# Patient Record
Sex: Female | Born: 1960 | Race: White | Hispanic: Yes | Marital: Married
Health system: Northeastern US, Academic
[De-identification: ages and names within clinical notes are randomized; demographics above are authoritative.]

---

## 2021-08-07 ENCOUNTER — Other Ambulatory Visit

## 2021-08-07 ENCOUNTER — Other Ambulatory Visit (HOSPITAL_BASED_OUTPATIENT_CLINIC_OR_DEPARTMENT_OTHER)

## 2021-08-08 ENCOUNTER — Encounter (HOSPITAL_BASED_OUTPATIENT_CLINIC_OR_DEPARTMENT_OTHER)

## 2021-08-08 ENCOUNTER — Other Ambulatory Visit (HOSPITAL_BASED_OUTPATIENT_CLINIC_OR_DEPARTMENT_OTHER)

## 2021-08-08 ENCOUNTER — Ambulatory Visit
Admission: RE | Admit: 2021-08-08 | Discharge: 2021-08-08 | Disposition: A | Discharge status: Home / Self Care | Source: Ambulatory Visit

## 2021-08-08 DIAGNOSIS — D259 Leiomyoma of uterus, unspecified: Secondary | ICD-10-CM

## 2021-08-16 ENCOUNTER — Encounter (HOSPITAL_BASED_OUTPATIENT_CLINIC_OR_DEPARTMENT_OTHER): Admitting: Gynecology

## 2021-09-19 ENCOUNTER — Other Ambulatory Visit

## 2021-09-20 ENCOUNTER — Other Ambulatory Visit

## 2021-09-20 ENCOUNTER — Encounter (HOSPITAL_BASED_OUTPATIENT_CLINIC_OR_DEPARTMENT_OTHER): Admitting: Gynecology

## 2021-09-25 ENCOUNTER — Ambulatory Visit (HOSPITAL_BASED_OUTPATIENT_CLINIC_OR_DEPARTMENT_OTHER): Admitting: Gynecology

## 2021-10-05 ENCOUNTER — Encounter

## 2021-10-05 ENCOUNTER — Other Ambulatory Visit

## 2021-10-05 ENCOUNTER — Ambulatory Visit (HOSPITAL_BASED_OUTPATIENT_CLINIC_OR_DEPARTMENT_OTHER): Admitting: Gynecology

## 2021-10-05 ENCOUNTER — Ambulatory Visit: Attending: Gynecology | Admitting: Gynecology

## 2021-10-05 VITALS — BP 127/84 | HR 73 | Temp 98.1°F | Ht 62.0 in | Wt 125.0 lb

## 2021-10-05 DIAGNOSIS — N952 Postmenopausal atrophic vaginitis: Secondary | ICD-10-CM

## 2021-10-05 MED ORDER — estradiol (Estrace) 0.01 % (0.1 mg/gram) vaginal cream
0.01 | Freq: Every evening | VAGINAL | 8 refills | Status: AC
Start: 2021-10-05 — End: 2022-10-05
  Filled 2021-10-05: qty 42.5, 90d supply, fill #0

## 2021-10-05 NOTE — Progress Notes (Signed)
Patient ID: April Dickson is a 61 y.o. female.    Uroflowmetry    Date/Time: 10/05/2021 1:24 PM    Performed by: Eliezer Bottom, RN  Authorized by: Ansel Bong, MD    Procedure Details     Procedure: uroflow      Uroflow Details:     Voiding duration (sec): 17.3    Average flow rate (mL/sec): 8.1    Max flow rate (mL/sec): 18.3    Type of curve: bell variant      Voided urine (mL): 140    Normal void for patient: yes      Post-Procedure Details     Outcome: patient tolerated procedure well with no complications

## 2021-10-05 NOTE — Progress Notes (Signed)
Division of Urogynecology and Reconstructive Pelvic Surgery    NEW CONSULTATION VISIT    Referring Physician: No ref. provider found  No care team member to display    IDENTIFICATION AND CHIEF COMPLAINT  April Dickson is a 61 y.o. No obstetric history on file. female who presents for a new patient consultation.    HISTORY OF PRESENT ILLNESS    Has left sided abdominal pain, right above the pubic area   It is a cramping pain if lays down and puts legs up   Occurs twice a month  Presents here for pelvic pain   If constipated which occurs occasionally   +POP sx   Had a pessary in but took it out 6 months ago   Didn't feel like it was helping her pain or discomfort   Denies SUI or UUI   Still has her ovaries  History of supracervical hysterectomy   Lives in [place], but her daughter lives in [place] so she comes back and forth   History of diverticulosis   L sided pain, up to 7 or 8/10  Happens twice a month   Went to [facility] - helped   Sometimes feels some dull pain when she urinates   Some vaginal dryness       PROBLEM LIST  There are no problems to display for this patient.      HISTORY  No past medical history on file.    No past surgical history on file.    OB History   No obstetric history on file.         No family history on file.    Social History     Socioeconomic History   . Marital status: Married     Spouse name: Not on file   . Number of children: Not on file   . Years of education: Not on file   . Highest education level: Not on file   Occupational History   . Not on file   Tobacco Use   . Smoking status: Not on file   . Smokeless tobacco: Not on file   Substance and Sexual Activity   . Alcohol use: Not on file   . Drug use: Not on file   . Sexual activity: Not on file   Other Topics Concern   . Not on file   Social History Narrative   . Not on file     Social Determinants of Health     Financial Resource Strain: Not on file   Food Insecurity: Not on file   Transportation Needs: Not on file   Physical  Activity: Not on file   Stress: Not on file   Social Connections: Not on file   Intimate Partner Violence: Not on file   Housing Stability: Not on file       Current Outpatient Medications   Medication Sig Dispense Refill   . levothyroxine (Synthroid, Levoxyl) 25 mcg tablet Take 25 mcg by mouth in the morning.       No current facility-administered medications for this visit.       ALLERGIES:    Iodine-isopropyl alcohol, Sulfa (sulfonamide antibiotics), and Sulfamethoxazole-trimethoprim    REVIEW OF SYSTEMS    Review of Systems   Constitutional: Negative.    HENT: Negative.    Eyes: Negative.    Respiratory: Negative.    Cardiovascular: Negative.    Gastrointestinal: Negative.    Endocrine: Negative.    Genitourinary: Negative.    Musculoskeletal: Negative.  Skin: Negative.    Allergic/Immunologic: Negative.    Neurological: Negative.    Hematological: Negative.    Psychiatric/Behavioral: Negative.        PHYSICAL EXAM  BP 127/84 (BP Location: Right arm, Patient Position: Sitting, BP Cuff Size: Adult)   Pulse 73   Temp 36.7 C (98.1 F) (Oral)   Ht 1.575 m   Wt 56.7 kg   BMI 22.86 kg/m     Constitutional: Pleasant, cooperative, appears stated age.   HENT: Head normocephalic and atraumatic.   Cardiovascular: No peripheral edema  Pulmonary/Chest: Effort normal. No increased work of breathing.   Abdominal: Soft, nontender, nondistended  Neurological: Alert   Psychiatric: Normal mood and affect.     PELVIC:   Vulva: no lesions, nontender  Urethra: nontender, no mass/lesion  +urethral hypermobility   Bladder: nontender  Vagina: nontender, no mass/lesion  Estrogenization: atrophic  Cervix: normal, no lesions   Uterus: normal, nontender, not enlarged  Adnexa:  nontender, no masses  Rectal: Not performed     The urethra was prepped and cleaned with Betadine. A catheter with lubrication was introduced into the urethra and a post void residual was performed.     Cough Stress Test: negative       Levator/Kegel:   Levator/Kegel: 5/5   Tenderness: no    Pop-Q Evaluation    Aa: -1 Ba: - 1  C : -7   Gh: 3.5 Pb: 3 TVL: 10   Ap: -3 Bp:-3  D: -9       ASSESSMENT AND PLAN    61 yo with left lower quadrant pelvic pain and small cervical fibroid pe mn r patient report     We discussed the potential relationship between the cervical fibroid and her pelvic pain. I discussed that while I wasn't sure about fibroid causing her pain, given it's size and location, I said it was a lower likelihood. However, I discussed that I was not an expert in fibroids causing pain and if she wanted it surgically removed I could refer her to Dr. [place] Service.      We reviewed her pelvic exam, that she has some prolapse but that it overall is non-bothersome   - will obtain images from her prior US and MRI   - will erx vaginal estrogen   - referral to Dr. [place] Service if patient desires surgical removal       Ansel Bong, MD

## 2021-10-09 ENCOUNTER — Encounter (HOSPITAL_BASED_OUTPATIENT_CLINIC_OR_DEPARTMENT_OTHER): Admitting: Gynecology

## 2021-11-20 ENCOUNTER — Inpatient Hospital Stay
Admit: 2021-11-20 | Discharge: 2021-11-20 | Disposition: A | Discharge status: Home / Self Care | Payer: BLUE CROSS/BLUE SHIELD | Attending: Emergency Medicine

## 2021-11-20 ENCOUNTER — Emergency Department: Admit: 2021-11-20 | Payer: BLUE CROSS/BLUE SHIELD

## 2021-11-20 DIAGNOSIS — R1032 Left lower quadrant pain: Secondary | ICD-10-CM

## 2021-11-20 DIAGNOSIS — R6889 Other general symptoms and signs: Secondary | ICD-10-CM

## 2021-11-20 LAB — COMPREHENSIVE METABOLIC PANEL
ALT: 24 U/L (ref 0–55)
AST: 27 U/L (ref 6–42)
Albumin: 4.6 g/dL (ref 3.2–5.0)
Alkaline phosphatase: 94 U/L (ref 30–130)
Anion Gap: 11 mmol/L (ref 3–14)
BUN: 7 mg/dL (ref 6–24)
Bilirubin, total: 0.5 mg/dL (ref 0.2–1.2)
CO2 (Bicarbonate): 24 mmol/L (ref 20–32)
Calcium: 10.1 mg/dL (ref 8.5–10.5)
Chloride: 99 mmol/L (ref 98–110)
Creatinine: 0.68 mg/dL (ref 0.55–1.30)
Glucose: 117 mg/dL (ref 70–139)
Potassium: 3.7 mmol/L (ref 3.6–5.2)
Protein, total: 7.8 g/dL (ref 6.0–8.4)
Sodium: 134 mmol/L — ABNORMAL LOW (ref 135–146)
eGFRcr: 100 mL/min/{1.73_m2} (ref >=60)

## 2021-11-20 LAB — URINALYSIS REFLEX TO CULTURE
Bacteria, Ur: NEGATIVE
Bilirubin, Ur: NEGATIVE
Glucose, Ur: NEGATIVE
Hyaline Casts, Ur: 0 /LPF (ref 0–8)
Ketones, Ur: NEGATIVE
Leukocyte Esterase, Ur: NEGATIVE
Nitrite, Ur: NEGATIVE
Protein, Ur: NEGATIVE
RBC, Ur: 1 {cells}/[HPF] (ref 0–4)
Specific Gravity, Ur: 1.008 (ref 1.001–1.035)
Squamous Epithelial Cells, Ur: NEGATIVE /HPF
Urobilinogen, Ur: 0.2 EU
WBC, Ur: <1 {cells}/[HPF] (ref 0–5)
pH, Ur: 5.5

## 2021-11-20 LAB — CBC WITH DIFFERENTIAL
Basophils %: 0.1 %
Basophils Absolute: 0.01 10*3/uL (ref 0.00–0.22)
Eosinophils %: 0.4 %
Eosinophils Absolute: 0.05 10*3/uL (ref 0.00–0.50)
Hematocrit: 43.7 % (ref 32.0–47.0)
Hemoglobin: 14.7 g/dL (ref 11.0–16.0)
Immature Granulocytes %: 0.4 %
Immature Granulocytes Absolute: 0.06 10*3/uL (ref 0.00–0.10)
Lymphocyte %: 2.8 %
Lymphocytes Absolute: 0.4 10*3/uL — ABNORMAL LOW (ref 0.70–4.00)
MCH: 29.3 pg (ref 26.0–34.0)
MCHC: 33.6 g/dL (ref 31.0–37.0)
MCV: 87.2 fL (ref 80.0–100.0)
MPV: 10.3 fL (ref 9.1–12.4)
Monocytes %: 4.8 %
Monocytes Absolute: 0.68 10*3/uL (ref 0.36–0.77)
NRBC %: 0 % (ref 0.0–0.0)
NRBC Absolute: 0 10*3/uL (ref 0.00–2.00)
Neutrophil %: 91.5 %
Neutrophils Absolute: 12.88 10*3/uL — ABNORMAL HIGH (ref 1.50–7.95)
Platelets: 184 10*3/uL (ref 150–400)
RBC: 5.01 M/uL (ref 3.70–5.20)
RDW-CV: 12.5 % (ref 11.5–14.5)
RDW-SD: 40 fL (ref 35.0–51.0)
WBC: 14.1 10*3/uL — ABNORMAL HIGH (ref 4.0–11.0)

## 2021-11-20 LAB — LIPASE: Lipase: 53 U/L (ref 8–60)

## 2021-11-20 LAB — TROPONIN I: Troponin I: <0.01 ng/mL (ref <0.03)

## 2021-11-20 LAB — RAPID SARS-COV-2: SARS-CoV-2 RNA PCR: NOT DETECTED

## 2021-11-20 MED ORDER — sodium chloride 0.9 % bolus 1,000 mL
0.9 | Freq: Once | INTRAVENOUS | Status: AC
Start: 2021-11-20 — End: 2021-11-20
  Administered 2021-11-20: 10:00:00 1000 mL via INTRAVENOUS

## 2021-11-20 MED ORDER — barium sulfate (Readi-Cat 2) 2 % (w/v) suspension 450 mL
2 | Freq: Once | ORAL | Status: AC
Start: 2021-11-20 — End: 2021-11-20
  Administered 2021-11-20: 13:00:00 450 mL via ORAL

## 2021-11-20 MED ORDER — iopamidol (Isovue-300) 300 mg iodine /mL (61 %) injection 50 mL
300 | Freq: Once | INTRAVENOUS | Status: DC
Start: 2021-11-20 — End: 2021-11-20

## 2021-11-20 NOTE — ED Progress Note (Signed)
The Patient was stable in the emergency department.  He has  work-up shows only an elevated WBC count.  Her chest radiograph and CT abdomen/pelvis showed no acute abnormalities.  Her urine is clear.  I explained her that this may be some type of non-COVID viral illness.  She had been on some type of antibiotic for traveler's diarrhea, I suggested she stop those.     I visualized the patient's chest radiographs.  The heart and mediastinal structures appear normal.  No pleural effusions or pulmonary infiltrates are seen.      Diagnosis: Rigors, elevated WBC  Disposition: Discharged     Genene Churn. Jeannett Senior, MD  11/20/21 1225

## 2021-11-20 NOTE — Discharge Instructions (Addendum)
You were seen in the emergency department after an episode of shaking chills last night.  He also had left lower quadrant abdominal pain.  He had a rather extensive evaluation which included: Blood count, liver and kidney function test, urinalysis, chest x-ray, and CT of abdomen/pelvis.  You are also tested for COVID-19.  All your testing results were negative except for: WBC count of 14.4.  We cannot find a serious infection however you may be having a viral illness.  We recommend: Rest, drink plenty of fluids, you should stop the antibiotics that you have been taking up to this time.  Return if symptoms worsen or change.  Of 14.4.

## 2021-11-20 NOTE — ED Provider Notes (Addendum)
 EMERGENCY DEPARTMENT ENCOUNTER    ATTENDING NOTE    Date of service: 11/20/2021  3:50 AM    HISTORY OF PRESENT ILLNESS:    61 y.o. female with a history of cervical fibrroid presents with fevers and chills and rigors over the past day.  She has also had the symptoms of left lower quadrant abdominal pain and suprapubic pain for  2 weeks, also with a low-grade version of this for longer.  She has been seen outpatient gynecology and is being worked up from that standpoint.  She was also given rifaximin by an outpatient physician for diverticulitis presumed     PAST MEDICAL HISTORY / PROBLEM LIST    Past Medical History:   Diagnosis Date   . Anxiety        There is no problem list on file for this patient.      PAST SURGICAL HISTORY    No past surgical history on file.    MEDICATIONS     Discharge Medication List as of 11/20/2021 12:36 PM      CONTINUE these medications which have NOT CHANGED    Details   levothyroxine (Synthroid, Levoxyl) 25 mcg tablet Take 25 mcg by mouth in the morning., Starting Wed 08/02/2021, Historical Med      estradiol (Estrace) 0.01 % (0.1 mg/gram) vaginal cream Insert 1 gram into the vagina at bedtime for 2 weeks, then at bedtime twice a week., Starting Thu 10/05/2021, Until Fri 10/05/2022, Normal              ALLERGIES    Allergies   Allergen Reactions   . Iodine-Isopropyl Alcohol    . Sulfa (Sulfonamide Antibiotics) Headache, Hives, Itching, Nausea Only and Nausea / Vomiting     Other reaction(s): vomiting   . Iodinated Contrast Media Itching     PT states reaction was 28 years ago, had itching/rash and felt like her throat was closing, she was given benadryl immediately after.  Other reaction(s): Itching/rash, Resp. Symptoms   . Iodine    . Sulfamethoxazole-Trimethoprim    . Levothyroxine Rash       SOCIAL HISTORY    Social History     Tobacco Use   . Smoking status: Never   . Smokeless tobacco: Never   Vaping Use   . Vaping status: Not on file   Substance Use Topics   . Alcohol use: Never      Social History     Substance and Sexual Activity   Drug Use Never       FAMILY HISTORY    No family history on file.    TRIAGE VITALS    ED Triage Vitals [11/20/21 0347]   Temp Pulse Resp BP   37.7 C (99.8 F) (!) 136 20 136/78      SpO2 Temp Source Heart Rate Source Patient Position   98 % Temporal Monitor Sitting      BP Location FiO2 (%)     Left arm --          PHYSICAL EXAM    General appearance: alert, pleasant, in no apparent distress  Eye exam: Absent: conjunctival icterus  ENT exam: normal oropharynx  Respiratory exam: Present: normal lung sounds bilaterally  Cardiovascular exam: Present: regular rate, normal rhythm   Abdominal exam: Present: soft.  +: LLQ tenderness  No CVA tenderness bilaterally   No rebound or guarding   Neurological exam: Present: alert, no facial droop, moving all four extremities spontaneously  Psychiatric exam: Present:  normal affect, normal mood  Skin exam: Present: warm, dry    MEDICAL DECISION MAKING    Nursing notes reviewed and I agree.    TESTS AND WORKUP CONSIDERED (including any clinical decision rules)  Labs, CT abdomen pelvis    TREATMENT CONSIDERED  IV fluids given    EXTERNAL RECORDS AND EXTERNAL DATA REVIEWED  Outpatient notes reviewed    INDEPENDENT INTERPRETATIONS    EKG, LABS, RADIOLOGY, OTHER      Labs Reviewed   URINALYSIS REFLEX TO CULTURE - Abnormal       Result Value    Color, Ur Yellow      Clarity, Ur Clear      Specific Gravity, Ur 1.008      pH, Ur 5.5      Protein, Ur Negative      Glucose, Ur Negative      Ketones, Ur Negative      Blood, Ur 1+ (*)     Bilirubin, Ur Negative      Urobilinogen, Ur 0.2      Nitrite, Ur Negative      Leukocyte Esterase, Ur Negative      WBC, Ur <1      RBC, Ur 1      Squamous Epithelial Cells, Ur Negative      Bacteria, Ur Negative      Hyaline Casts, Ur 0.0     COMPREHENSIVE METABOLIC PANEL - Abnormal    Sodium 134 (*)     Potassium 3.7      Chloride 99      CO2 (Bicarbonate) 24      Anion Gap 11      BUN 7       Creatinine 0.68      eGFRcr 100      Glucose 117      Fasting? Unknown      Calcium 10.1      AST 27      ALT 24      Alkaline phosphatase 94      Protein, total 7.8      Albumin 4.6      Bilirubin, total 0.5     CBC WITH DIFFERENTIAL - Abnormal    WBC 14.1 (*)     RBC 5.01      Hemoglobin 14.7      Hematocrit 43.7      MCV 87.2      MCH 29.3      MCHC 33.6      RDW-CV 12.5      RDW-SD 40.0      Platelets 184      MPV 10.3      Neutrophil % 91.5      Lymphocyte % 2.8      Monocytes % 4.8      Eosinophils % 0.4      Basophils % 0.1      Immature Granulocytes % 0.4      NRBC % 0.0      Neutrophils Absolute 12.88 (*)     Lymphocytes Absolute 0.40 (*)     Monocytes Absolute 0.68      Eosinophils Absolute 0.05      Basophils Absolute 0.01      Immature Granulocytes Absolute 0.06      NRBC Absolute 0.00     BLOOD CULTURE - Normal    Blood Culture No growth at 1 day     BLOOD CULTURE - Normal    Blood Culture No  growth at 1 day     LIPASE - Normal    Lipase 53     TROPONIN I - Normal    Troponin I <0.01     RAPID SARS-COV-2 - Normal    SARS-CoV-2 RNA PCR Not Detected     COVID-19 AND OTHER RESPIRATORY VIRAL PCR TESTING    Narrative:     The following orders were created for panel order COVID-19 and Other Respiratory Viral PCR Tests.  Procedure                               Abnormality         Status                     ---------                               -----------         ------                     Rapid SARS-CoV-2[[id number]]             Normal              Final result                 Please view results for these tests on the individual orders.   CBC W/DIFF    Narrative:     The following orders were created for panel order CBC W/DIFF.  Procedure                               Abnormality         Status                     ---------                               -----------         ------                     CBC w/ Differential[[id number]]          Abnormal            Final result                 Please view results  for these tests on the individual orders.        XR CHEST 2 VIEWS   Final Result      No radiographically apparent acute cardiopulmonary process.      APPROVED BY STAFF RADIOLOGIST: Ardis Rowan 11/20/2021 12:02 PM EDT      CT ABDOMEN PELVIS WO CONTRAST   Final Result   1.  Colonic diverticulosis without evidence of acute diverticulitis.      2.  No kidney stones or hydronephrosis.      3.  Patient is post supracervical hysterectomy. Just posterior to the cervix and anterior to the rectum is a lobular soft tissue lesion as described above. Prior ultrasound report indicated history of fibroid of the cervix. Question exophytic fibroid but exact etiology is indeterminate. This may also be potentially postsurgical in nature given history of prior surgery. This was not  reported on patient's prior pelvic ultrasound. Correlation with history recommended as no old CT imaging is available to assess for changes. MRI may be of value.      4.  Mild nonspecific stranding along the mesenteric root with some prominent mesenteric lymph nodes. This may potentially be incidental in nature. Follow-up CT in 6 months can be obtained to assess for stability.            APPROVED BY STAFF RADIOLOGIST: Darcel Bayley, MD 11/20/2021 11:46 AM EDT          MY WORKING CLINICAL IMPRESSION / DIAGNOSIS (and other differential diagnosis items as pertinent):  Abdominal pain, pelvic pain, mass, fibroid, viral infection, bacterial infection, sepsis     ED Course as of 11/21/21 2255   Tue Nov 21, 2021   2253 WBC(!): 14.1  elevated   2253 CT ABDOMEN PELVIS WO CONTRAST  Not reviewed at signout   2254 ECG 12 lead  I have visualized, examined, and interpreted the patient's EKG and this shows: Sinus tachycardia, no st elevation          Diagnoses as of 11/21/21 2255   Rigors       Disposition and Recommendations: Patient was signed out to Dr. Jeannett Senior pending CT results and reevaluation.       Darene Lamer, MD  11/21/21 1308       Darene Lamer,  MD  11/21/21 2255

## 2021-11-20 NOTE — ED Notes (Signed)
Resting comfortably  In bed , handed over to [employer] Kiara.      Sherian MaroonLailanie Ladislao Cohenour, RN  11/20/21 [phone]

## 2021-11-20 NOTE — ED Provider Notes (Signed)
EMERGENCY DEPARTMENT ENCOUNTER    Physician Assistant Note    Date of service: 11/20/2021  3:50 AM    HISTORY OF PRESENT ILLNESS:    61 y.o. female with a history of anxiety, cervical fibroid who presents with fever, chills, and urinary frequency which began acutely at 8pm on 11/19/21. PT reports she was in her usual state of health and while on the phone she developed these symptoms. PT reports she took Tylenol with transient improvement and went to sleep however she awoke overnight with recurrance of fever and chills. PT states she feels some mild suprapubic abd pain which she describes as a mild pressure. PT denies cough, SOB, nausea, vomiting, diarrhea, constipation.    PT reports she has been taking Rifaximin for the past ten days for treatment of diverticulosis.    INDEPENDENT HISTORIAN    History provided by:  PT and chart  History limited by:  none  Language interpreter used: no    PAST MEDICAL HISTORY / PROBLEM LIST    Past Medical History:   Diagnosis Date   . Anxiety        There is no problem list on file for this patient.      PAST SURGICAL HISTORY    No past surgical history on file.    MEDICATIONS     Discharge Medication List as of 11/20/2021 12:36 PM      CONTINUE these medications which have NOT CHANGED    Details   estradiol (Estrace) 0.01 % (0.1 mg/gram) vaginal cream Insert 1 gram into the vagina at bedtime for 2 weeks, then at bedtime twice a week., Starting Thu 10/05/2021, Until Fri 10/05/2022, Normal      levothyroxine (Synthroid, Levoxyl) 25 mcg tablet Take 25 mcg by mouth in the morning., Starting Wed 08/02/2021, Historical Med              ALLERGIES    Allergies   Allergen Reactions   . Iodine-Isopropyl Alcohol    . Sulfa (Sulfonamide Antibiotics) Headache, Hives, Itching, Nausea Only and Nausea / Vomiting     Other reaction(s): vomiting   . Iodinated Contrast Media Itching     PT states reaction was 28 years ago, had itching/rash and felt like her throat was closing, she was given benadryl  immediately after.  Other reaction(s): Itching/rash, Resp. Symptoms   . Iodine    . Sulfamethoxazole-Trimethoprim    . Levothyroxine Rash       SOCIAL HISTORY    Social History     Tobacco Use   . Smoking status: Never   . Smokeless tobacco: Never   Vaping Use   . Vaping status: Not on file   Substance Use Topics   . Alcohol use: Never     Social History     Substance and Sexual Activity   Drug Use Never       FAMILY HISTORY    No family history on file.    TRIAGE VITALS    ED Triage Vitals [11/20/21 0347]   Temp Pulse Resp BP   37.7 C (99.8 F) (!) 136 20 136/78      SpO2 Temp Source Heart Rate Source Patient Position   98 % Temporal Monitor Sitting      BP Location FiO2 (%)     Left arm --          REVIEW OF SYSTEMS  Constitutional: Pos for fever or chills.  Respiratory: Neg for cough, SOB.  Abdomen: Pos for abdominal  pain. Neg for nausea, vomiting, diarrhea, constipation.  GU: Pos for urinary symptoms.  Psych: Pos for anxiety.    PHYSICAL EXAM    ED Triage Vitals [11/20/21 0347]   Temp Pulse Resp BP   37.7 C (99.8 F) (!) 136 20 136/78      SpO2 Temp Source Heart Rate Source Patient Position   98 % Temporal Monitor Sitting      BP Location FiO2 (%)     Left arm --          General: Patient is awake and alert and non-toxic appearing.      Cardiac: Tachycardic to 130, normal S1S2, no LE edema, no murmurs/rubs/gallops.    Chest: No deformities. Equal chest rise and fall.    Respiratory: Respiratory effort is normal, no respiratory distress, lungs clear to auscultation bilaterally.    Abdomen: Appears flat, soft, slight suprapubic TTP, remainder abdomen NTTP diffusely.    Psychiatric: The patient is calm and cooperative.      MEDICAL DECISION MAKING    Nursing notes reviewed and I agree.    ACUTE CONDITIONS  Initial differential diagnosis: UTI, pyelonephritis    CHRONIC CONDITIONS  Anxiety, cervical fibroud    EXTERNAL RECORDS AND EXTERNAL DATA REVIEWED  Yes, urogyn clinic visit 09/14/21    TESTS AND WORKUP  CONSIDERED  Labs, imaging    TREATMENT CONSIDERED  Analgesia, IV fluids, abx    INDEPENDENT INTERPRETATIONS    LABS      Labs Reviewed   URINALYSIS REFLEX TO CULTURE - Abnormal       Result Value    Color, Ur Yellow      Clarity, Ur Clear      Specific Gravity, Ur 1.008      pH, Ur 5.5      Protein, Ur Negative      Glucose, Ur Negative      Ketones, Ur Negative      Blood, Ur 1+ (*)     Bilirubin, Ur Negative      Urobilinogen, Ur 0.2      Nitrite, Ur Negative      Leukocyte Esterase, Ur Negative      WBC, Ur <1      RBC, Ur 1      Squamous Epithelial Cells, Ur Negative      Bacteria, Ur Negative      Hyaline Casts, Ur 0.0     COMPREHENSIVE METABOLIC PANEL - Abnormal    Sodium 134 (*)     Potassium 3.7      Chloride 99      CO2 (Bicarbonate) 24      Anion Gap 11      BUN 7      Creatinine 0.68      eGFRcr 100      Glucose 117      Fasting? Unknown      Calcium 10.1      AST 27      ALT 24      Alkaline phosphatase 94      Protein, total 7.8      Albumin 4.6      Bilirubin, total 0.5     CBC WITH DIFFERENTIAL - Abnormal    WBC 14.1 (*)     RBC 5.01      Hemoglobin 14.7      Hematocrit 43.7      MCV 87.2      MCH 29.3      MCHC 33.6  RDW-CV 12.5      RDW-SD 40.0      Platelets 184      MPV 10.3      Neutrophil % 91.5      Lymphocyte % 2.8      Monocytes % 4.8      Eosinophils % 0.4      Basophils % 0.1      Immature Granulocytes % 0.4      NRBC % 0.0      Neutrophils Absolute 12.88 (*)     Lymphocytes Absolute 0.40 (*)     Monocytes Absolute 0.68      Eosinophils Absolute 0.05      Basophils Absolute 0.01      Immature Granulocytes Absolute 0.06      NRBC Absolute 0.00     BLOOD CULTURE - Normal    Blood Culture No growth at 4 days     BLOOD CULTURE - Normal    Blood Culture No growth at 4 days     LIPASE - Normal    Lipase 53     TROPONIN I - Normal    Troponin I <0.01     RAPID SARS-COV-2 - Normal    SARS-CoV-2 RNA PCR Not Detected     COVID-19 AND OTHER RESPIRATORY VIRAL PCR TESTING    Narrative:     The  following orders were created for panel order COVID-19 and Other Respiratory Viral PCR Tests.  Procedure                               Abnormality         Status                     ---------                               -----------         ------                     Rapid SARS-CoV-2[[id number]]             Normal              Final result                 Please view results for these tests on the individual orders.   CBC W/DIFF    Narrative:     The following orders were created for panel order CBC W/DIFF.  Procedure                               Abnormality         Status                     ---------                               -----------         ------                     CBC w/ Differential[[id number]]          Abnormal            Final result  Please view results for these tests on the individual orders.        RADIOLOGY  If applicable I have visualized, examined, and interpreted any of the patient's images and I agree with the radiologists report: Yes    XR CHEST 2 VIEWS   Final Result      No radiographically apparent acute cardiopulmonary process.      APPROVED BY STAFF RADIOLOGIST: Ardis Rowan 11/20/2021 12:02 PM EDT      CT ABDOMEN PELVIS WO CONTRAST   Final Result   1.  Colonic diverticulosis without evidence of acute diverticulitis.      2.  No kidney stones or hydronephrosis.      3.  Patient is post supracervical hysterectomy. Just posterior to the cervix and anterior to the rectum is a lobular soft tissue lesion as described above. Prior ultrasound report indicated history of fibroid of the cervix. Question exophytic fibroid but exact etiology is indeterminate. This may also be potentially postsurgical in nature given history of prior surgery. This was not reported on patient's prior pelvic ultrasound. Correlation with history recommended as no old CT imaging is available to assess for changes. MRI may be of value.      4.  Mild nonspecific stranding along the mesenteric root with  some prominent mesenteric lymph nodes. This may potentially be incidental in nature. Follow-up CT in 6 months can be obtained to assess for stability.            APPROVED BY STAFF RADIOLOGIST: Darcel Bayley, MD 11/20/2021 11:46 AM EDT          DISCUSSION OF MANAGEMENT OR TEST INTERPRETATION WITH OTHER PROVIDERS INCLUDING CONSULTS  NA    HOSPITALIZATION CONSIDERED  TBD    SOCIAL DETERMINATES OF HEALTH  No barriers    Medications   sodium chloride 0.9 % bolus 1,000 mL (0 mL intravenous Stopped 11/20/21 0747)   barium sulfate (Readi-Cat 2) 2 % (w/v) suspension 450 mL (450 mL oral Given 11/20/21 0830)     Diagnoses as of 11/24/21 1501   Rigors       MY WORKING CLINICAL IMPRESSION / DIAGNOSIS:  rigors    Disposition and Recommendations: HO to PA Fakhar at my shift change: PT is a 81 F who presented with chills and urinary frequency. UA clear, WBC elevated. PT pending CT for further characterization of her presentation.    Patient agreeable to plan.       Barnett Hatter Erling Arrazola, [place]  11/24/21 1503

## 2021-11-20 NOTE — ED Notes (Signed)
Pt. In room A3, A/O X4, non-distressed, appears anxious with noted increased HR,warm to touch, +subjective fever,  With bearable abd. Pain 3/10 as claimed , Awaits to be seen by the ED provider.      Sherian Maroon, RN  11/20/21 [phone]

## 2021-11-20 NOTE — ED Notes (Signed)
Assumed care of pt in room A3, pt reports fevers, chills and urinary frequency. Pt awaits oral contrast for CT scan. Respirations even and unlabored. Pt denies pain. Educated on plan of care.      Laqueta Carina, RN  11/20/21 [phone]

## 2021-11-20 NOTE — ED Triage Notes (Signed)
Patient ambulatory to triage. At approximately 9pm reports chills, body aches, and fever Tmax 99.5. Pt endorses increased urinary frequency, dry mouth and nausea. Denies dysuria, emesis, and diarrhea. Last dose tylenol 500mg  10pm

## 2021-11-20 NOTE — ED Provider Notes (Signed)
Transfer of Care Progress Note    RESULTS        LABS  Labs Reviewed   URINALYSIS REFLEX TO CULTURE - Abnormal       Result Value    Color, Ur Yellow      Clarity, Ur Clear      Specific Gravity, Ur 1.008      pH, Ur 5.5      Protein, Ur Negative      Glucose, Ur Negative      Ketones, Ur Negative      Blood, Ur 1+ (*)     Bilirubin, Ur Negative      Urobilinogen, Ur 0.2      Nitrite, Ur Negative      Leukocyte Esterase, Ur Negative      WBC, Ur <1      RBC, Ur 1      Squamous Epithelial Cells, Ur Negative      Bacteria, Ur Negative      Hyaline Casts, Ur 0.0     COMPREHENSIVE METABOLIC PANEL - Abnormal    Sodium 134 (*)     Potassium 3.7      Chloride 99      CO2 (Bicarbonate) 24      Anion Gap 11      BUN 7      Creatinine 0.68      eGFRcr 100      Glucose 117      Fasting? Unknown      Calcium 10.1      AST 27      ALT 24      Alkaline phosphatase 94      Protein, total 7.8      Albumin 4.6      Bilirubin, total 0.5     CBC WITH DIFFERENTIAL - Abnormal    WBC 14.1 (*)     RBC 5.01      Hemoglobin 14.7      Hematocrit 43.7      MCV 87.2      MCH 29.3      MCHC 33.6      RDW-CV 12.5      RDW-SD 40.0      Platelets 184      MPV 10.3      Neutrophil % 91.5      Lymphocyte % 2.8      Monocytes % 4.8      Eosinophils % 0.4      Basophils % 0.1      Immature Granulocytes % 0.4      NRBC % 0.0      Neutrophils Absolute 12.88 (*)     Lymphocytes Absolute 0.40 (*)     Monocytes Absolute 0.68      Eosinophils Absolute 0.05      Basophils Absolute 0.01      Immature Granulocytes Absolute 0.06      NRBC Absolute 0.00     BLOOD CULTURE - Normal    Blood Culture No growth at 2 days     BLOOD CULTURE - Normal    Blood Culture No growth at 2 days     LIPASE - Normal    Lipase 53     TROPONIN I - Normal    Troponin I <0.01     RAPID SARS-COV-2 - Normal    SARS-CoV-2 RNA PCR Not Detected     COVID-19 AND OTHER RESPIRATORY VIRAL PCR TESTING    Narrative:  The following orders were created for panel order COVID-19 and Other  Respiratory Viral PCR Tests.  Procedure                               Abnormality         Status                     ---------                               -----------         ------                     Rapid SARS-CoV-2[[id number]]             Normal              Final result                 Please view results for these tests on the individual orders.   CBC W/DIFF    Narrative:     The following orders were created for panel order CBC W/DIFF.  Procedure                               Abnormality         Status                     ---------                               -----------         ------                     CBC w/ Differential[[id number]]          Abnormal            Final result                 Please view results for these tests on the individual orders.       RADIOLOGY  XR CHEST 2 VIEWS   Final Result      No radiographically apparent acute cardiopulmonary process.      APPROVED BY STAFF RADIOLOGIST: Ardis Rowan 11/20/2021 12:02 PM EDT      CT ABDOMEN PELVIS WO CONTRAST   Final Result   1.  Colonic diverticulosis without evidence of acute diverticulitis.      2.  No kidney stones or hydronephrosis.      3.  Patient is post supracervical hysterectomy. Just posterior to the cervix and anterior to the rectum is a lobular soft tissue lesion as described above. Prior ultrasound report indicated history of fibroid of the cervix. Question exophytic fibroid but exact etiology is indeterminate. This may also be potentially postsurgical in nature given history of prior surgery. This was not reported on patient's prior pelvic ultrasound. Correlation with history recommended as no old CT imaging is available to assess for changes. MRI may be of value.      4.  Mild nonspecific stranding along the mesenteric root with some prominent mesenteric lymph nodes. This may potentially be incidental in nature. Follow-up CT in 6 months can be obtained to assess for  stability.            APPROVED BY STAFF RADIOLOGIST: Darcel Bayley, MD 11/20/2021 11:46 AM EDT           ED COURSE & MEDICAL DECISION MAKING        Pertinent Labs & Imaging studies reviewed. (See chart for details)    Diagnoses as of 11/22/21 1838   Rigors     I assumed care of this patient at 7am, pending CT studies. In brief, this is a 61 y.o. female who presented to the ED complaining of   Chief Complaint   Patient presents with   . Fever   . Chills   and urinary frequency. CT study showed colonic diverticulosis without evidence of acute diverticulitis and no acute explanation for the patient's pain.  As the patient was exhibiting rigors a chest x-ray was obtained which showed no apparent acute cardiopulmonary processes.  Blood work is negative for troponin negative heart COVID-19 negative lipase mild hyponatremia at 134 and elevated leukocytosis at 14.1 without any obvious source of infection at this time.  Urine unremarkable.  Patient was afebrile and vital signs are stable and she was currently symptom-free.  Given her reassuring imaging and labs patient was discharged with strict return instructions given to her verbally and in written form which she stated she understood.        CLINICAL IMPRESSION  1. Rigors         Final Disposition:     Discharge    Dava Najjar, PA      [place], [place]  11/22/21 1840

## 2021-11-20 NOTE — ED Notes (Addendum)
Pt. For CT abd./Pelvis with contrast, pt. Claimed that she had  Reaction once  With PO iodide, ED provider aware and explained to the pt. About the protocol if reaction  Happens , pt. Verbalize understanding.   Sherian MaroonLailanie Kiarah Eckstein, RN  11/20/21 0641       Sherian MaroonLailanie Magdelena Kinsella, RN  11/20/21 [phone]

## 2021-11-24 NOTE — Telephone Encounter (Signed)
Reason for appointment: lower abdominal pain    How long have you had these symptoms? 3 days    Are symptoms new or ongoing? A little both     Are you currently under the care of a GI provider? No                   -If yes, then whom:  Have you seen a GI provider in the past: yes     If yes: Provider name: Dr Milas Gain, Nedra Hai   Address:   Phone number:  Is this a second opinion?no    Who is the referring provider? n/a    Patient call back number is: [phone]    ** Patient have been informed that medical records needs to be faxed to [place] at [phone] before their appointment**

## 2021-11-25 LAB — BLOOD CULTURE
Blood Culture: NO GROWTH
Blood Culture: NO GROWTH

## 2021-12-18 ENCOUNTER — Ambulatory Visit: Admitting: Gynecology

## 2021-12-18 ENCOUNTER — Encounter

## 2021-12-18 DIAGNOSIS — D219 Benign neoplasm of connective and other soft tissue, unspecified: Secondary | ICD-10-CM

## 2021-12-18 NOTE — Progress Notes (Signed)
Patient Name: April Dickson    Patient MRN: [id number]  Todays Date: 12/18/2021    LMP: No LMP recorded. Patient has had a hysterectomy.    CC: "pelvic pain, dyspareunia"    HPI: April Dickson is a lovely 61 y.o. G2P2 who presents to clinic today for lower left abdominal pain. She has a history of a supracervical hysterectomy in 2013.     The patient notes that for the past 3 years she has noticed  Pain, at first sleeping at night and woke up with pain, took tylenol and it went away, then when bent over she noticed pain again. She thought maybe a hernia.  Felt like a menstrual cramp, dull ache, she went to see her pcp 6 months later because had another episode.      She has diverticulosis - has had treatment with abx for the pain with assumption that may be diverticulosos    Had another episode of pain. She had a CT scan and it showed a small fibroid off of the cervix.  She saw her gyn who was able to reproducible with pushing on it in that location.      She notes it just keeps coming back and notes she avoids bending over because it will cause the pain again.    Recently went to the ED bc she had episode of pain again and then chills with a low grade fever.  She was shivering and could feel fevers, felt like temp was 100.  She had a CT not done with contrast because of iodine allergy.      FINDINGS:   Status post supracervical hysterectomy. The cervix appears normal, with a small nabothian cyst. No solid mass is identified.    The right ovary measures 1.7 x 0.9 x 0.8 cm and has a normal postmenopausal appearance, with no mass or cyst. No abnormal blood flow is demonstrated by color or spectral Doppler.    The left ovary was not identified. No adnexal mass is demonstrated.    There is no free fluid.     There is no prior study available for comparison.    IMPRESSION:  Status post supracervical hysterectomy. No mass identified.      CT: IMPRESSION:  1.  Colonic diverticulosis without evidence of acute  diverticulitis.    2.  No kidney stones or hydronephrosis.    3.  Patient is post supracervical hysterectomy. Just posterior to the cervix and anterior to the rectum is a lobular soft tissue lesion as described above. Prior ultrasound report indicated history of fibroid of the cervix. Question exophytic fibroid but exact etiology is indeterminate. This may also be potentially postsurgical in nature given history of prior surgery. This was not reported on patient's prior pelvic ultrasound. Correlation with history recommended as no old CT imaging is available to assess for changes. MRI may be of value.    4.  Mild nonspecific stranding along the mesenteric root with some prominent mesenteric lymph nodes. This may potentially be incidental in nature. Follow-up CT in 6 months can be obtained to assess for stability.    Last year she did PFPT she felt it did help but then things were very busy for her- daughter had thyroid cancer and lost several family memers.     Now symptoms are the same, at times gets up to 8/9 and had to go to the ED for the fevers and chills. , seems to be occuring ,ore frequently,    She has  gastroparensis, gastritis and dyspepsia seems to occur conissistently but then pain will occur as well. Sometimes pain goes down her leg, sometimes she feels the pain before she defecates, no affect on urination.     She has not been having sex because of pain during intercourse, same pain that she was getting.           Obstetric History  OB History     Gravida   2    Para   2    Term        Preterm        AB        Living   2       SAB        IAB        Ectopic        Multiple        Live Births               Obstetric Comments   SVD X 2   Gynecological History:  Menses:  None - postmenopausal  STD's: none   Sexually active: not now due to dyspareunia  Last pap: 2022 normal  BCM none  HPV Vaccine: none             Medical History  Past Medical History:   Diagnosis Date   . Anxiety    . Gastroparesis          Surgical History  Past Surgical History:   Procedure Laterality Date   . SUPRACERVICAL HYSTERECTOMY +/- BSO         Medications     Current Outpatient Medications:   .  estradiol (Estrace) 0.01 % (0.1 mg/gram) vaginal cream, Insert 1 gram into the vagina at bedtime for 2 weeks, then at bedtime twice a week., Disp: 42.5 g, Rfl: 8  .  levothyroxine (Synthroid, Levoxyl) 25 mcg tablet, Take 25 mcg by mouth in the morning., Disp: , Rfl:      Allergies  Allergies   Allergen Reactions   . Iodine-Isopropyl Alcohol    . Sulfa (Sulfonamide Antibiotics) Headache, Hives, Itching, Nausea Only and Nausea / Vomiting     Other reaction(s): vomiting   . Iodinated Contrast Media Itching     PT states reaction was 28 years ago, had itching/rash and felt like her throat was closing, she was given benadryl immediately after.  Other reaction(s): Itching/rash, Resp. Symptoms   . Iodine    . Sulfamethoxazole-Trimethoprim    . Levothyroxine Rash           Family History  No family history on file.    Social History  Social History     Tobacco Use   . Smoking status: Never   . Smokeless tobacco: Never   Substance Use Topics   . Alcohol use: Never   . Drug use: Never           Review of Symptoms  General: Denies fever/chills/night sweats, change in appetite/weight/sleep pattern  HEENT: Denies trauma, visual/hearing changes, vertigo, congestion, epistaxis  Skin: Denies rashes, itching, lesions, hives  CV: Denies chest pain, thrills, flutters  Pulmonary: Denies SOB, Dyspnea, Cough, Hemoptysis  Breasts: Denies masses, discharge, pain, tenderness  GI: Denies GERD, Nausea/Vomitting, Diarrhea/Constipation or Hemaochezia  GU: Denies dysuria, polyuria, gross hematuria, flank pain  MS: Denies muscle pain/weakness  Neuro: Denies seizures, numbness, tremors   Vascular: Denies  cramping/pain  Psych: Denies depression/SI/HI  Heme: Denies easy brusing/bleeding  Physical Exam  Patient is a well-appearing in no distress who appears her stated age.  HEENT exam is grossly normal. Thyroid is non enlarged.  There is no supraclavicular lymphadenopathy.   Heart: Regular rate and rhythm with no murmurs, rubs or gallops.  Normal S1 and S2.  Lungs - clear to auscultation bilaterally, no wheezes, rales or rhonci.  The abdomen is soft and nontender, non distended with normoactive bowel sounds. No organomegaly.  External genital exam reveals no vulvar lesions.  Skenes and Bartholin glands are normal. Bimanual examination reveals no lesions in the vestibule bladder or urethra. Cervix is mobile and tender with palpation. Theh fibroid is palpated with the tip of my finger and also causes discomfort, uterus is surgically absent. .  No adnexal masses or significant tenderness.  Skin exam shows no rash or suspicious lesions. Extremity exam shows no cyanosis, clubbing or edema.            A/P: April Dickson is a 61 y.o. G2P2 who presents to clinic today for   Issues Addressed       Codes    Fibroids    -  Primary D21.9    Relevant Orders    MR PELVIS W CONTRAST    History of hysterectomy     Z90.710    Myofascial pain syndrome     M79.18    Dyspareunia in female     N94.10    Relevant Orders    MR PELVIS W CONTRAST      Today I reviewed with April Dickson her CT scan and ultrasound imaging.  Her examinations have been consistent with pain around the location of the fibroid and cervix.  We discussed how patients after supracervical hysterectomy can develop cervical pain and dyspareunia.  She did have some improvement in her symptoms with pelvic floor physical therapy which would suggest a myofascial component.  Given that the CT is uncertain of findings of the fibroid and the ultrasound did not confirm I would like her to obtain an MRI for better detail as we may be proceeding with surgical evaluation.  In the meantime we decided to also restart pelvic floor physical therapy because if this were sufficient in alleviating her pain that she would not need to proceed with surgery.  If  this does not work then we will proceed with surgical evaluation with trachelectomy and removal of the fibroid as this is causing her recurrent pain and dyspareunia.

## 2022-05-22 ENCOUNTER — Ambulatory Visit: Payer: BLUE CROSS/BLUE SHIELD | Attending: Gastroenterology

## 2022-05-22 NOTE — Progress Notes (Deleted)
April Dickson is a 62 y.o.yo female with TAH 2011 (supracervical), gastritis/dyspepsia, diverticulosis, left lower abd pain.    84yrs of intermittent pelvic pain   She had a CT scan and it showed a small fibroid off of the cervix.  She saw her gyn who was able to reproducible with pushing on it in that location.    She notes it just keeps coming back and notes she avoids bending over because it will cause the pain again.  Recently went to the ED bc she had episode of pain again and then chills with a low grade fever.  She was shivering and could feel fevers, felt like temp was 100.  She had a CT not done with contrast because of iodine allergy.      -CT: IMPRESSION:  1. Colonic diverticulosis without evidence of acute diverticulitis.    2. No kidney stones or hydronephrosis.    3. Patient is post supracervical hysterectomy. Just posterior to the cervix and anterior to the rectum is a lobular soft tissue lesion as described above. Prior ultrasound report indicated history of fibroid of the cervix. Question exophytic fibroid but exact etiology is indeterminate. This may also be potentially postsurgical in nature given history of prior surgery. This was not reported on patient's prior pelvic ultrasound. Correlation with history recommended as no old CT imaging is available to assess for changes. MRI may be of value.    4. Mild nonspecific stranding along the mesenteric root with some prominent mesenteric lymph nodes. This may potentially be incidental in nature. Follow-up CT in 6 months can be obtained to assess for stability.      -One yr ago, had PFPT which helped.   -Now symptoms are the same, at times gets up to 8/9 and had to go to the ED for the fevers and chills. , seems to be occuring ,more frequently,  ----------------  6/23 saw gyn who suggested PFPT for possible myofascial pain syndrome; pelvic MRI to clarify ? Fibroid, consideration of surgery pending results.      Current Outpatient  Medications   Medication Instructions   . estradiol (Estrace) 0.01 % (0.1 mg/gram) vaginal cream Insert 1 gram into the vagina at bedtime for 2 weeks, then at bedtime twice a week.   . levothyroxine (SYNTHROID, LEVOXYL) 25 mcg, oral, Every morning       Allergies   Allergen Reactions   . Iodine-Isopropyl Alcohol    . Sulfa (Sulfonamide Antibiotics) Headache, Hives, Itching, Nausea Only and Nausea / Vomiting     Other reaction(s): vomiting   . Iodinated Contrast Media Itching     PT states reaction was 28 years ago, had itching/rash and felt like her throat was closing, she was given benadryl immediately after.  Other reaction(s): Itching/rash, Resp. Symptoms   . Iodine    . Sulfamethoxazole-Trimethoprim    . Levothyroxine Rash       Past Medical History:   Diagnosis Date   . Anxiety    . Gastroparesis        Past Surgical History:   Procedure Laterality Date   . CHOLECYSTECTOMY  2017   . SUPRACERVICAL HYSTERECTOMY +/- BSO          reports that she has never smoked. She has never used smokeless tobacco. She reports that she does not drink alcohol and does not use drugs.    No family history on file.    ROS:  Denies weight change, fevers, chills, fatigue, rash, easy bruising, jaundice, joint  aches, or eye complaints.  Denies headaches, lightheadedness, cough, shortness of breath, chest pain, dysuria, or hematuria.    Visit Vitals  OB Status Hysterectomy       PE:  Well-appearing, No acute distress  Skin: without rash or jaundice, HEENT: O/P benign, Neck: supple without thyromegaly, LN: No cervical or supraclavicular adenopathy, Back: No CVA or spinal tenderness, Chest: clear, CV: RRR, Abd: plus bowel sounds, nondistended, nontender, no obvious HSM or masses, Ext: no cyanosis, clubbing, or edema, Neuro: alert and oriented to person, place, and time/grossly nonfocal, Affect: seems appropriate.    Assessment/Recommendations:  ? PFPT, ? Pelvic MRI, ? Gyn f/u  ? Gastritis, ? Colonoscopy  ? Recent colonoscopy

## 2023-07-03 IMAGING — MR MRI LUMBAR SPINE WITHOUT CONTRAST
6 of 8 series · 11 of 48 positions shown · IV contrast (gadolinium)
Comparison: None.

________________________________________________________________________________________________ 
MRI LUMBAR SPINE WITHOUT CONTRAST, 07/03/2023 [DATE]: 
CLINICAL INDICATION: Lumbago With Sciatica, Right Side . Low back pain with 
radiation down bilateral legs for 2 years.
TECHNIQUE: Multiplanar, multiecho position MR images of the lumbar spine were 
performed without intravenous gadolinium enhancement. Patient was scanned on a 
1.5T magnet

[Series 101: survey · axial · 10.0mm · 1.25mm/px · 1 of 10 slices shown]
[im 1/10]
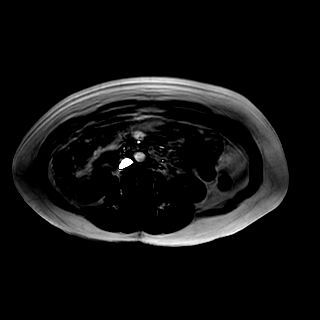

[Series 201: t2w_cor-surv · coronal · 6.0mm · 0.62mm/px · 1 of 10 slices shown]
[im 1/10]
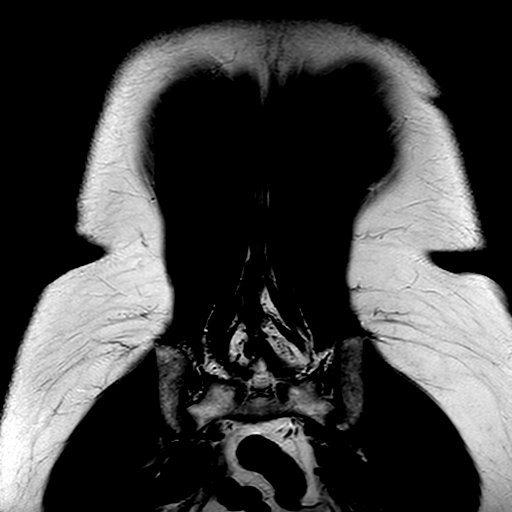

[Series 301: t1_tse_sag · sagittal · 4.0mm · 0.31mm/px · 2 of 19 slices shown]
[im 1/19]
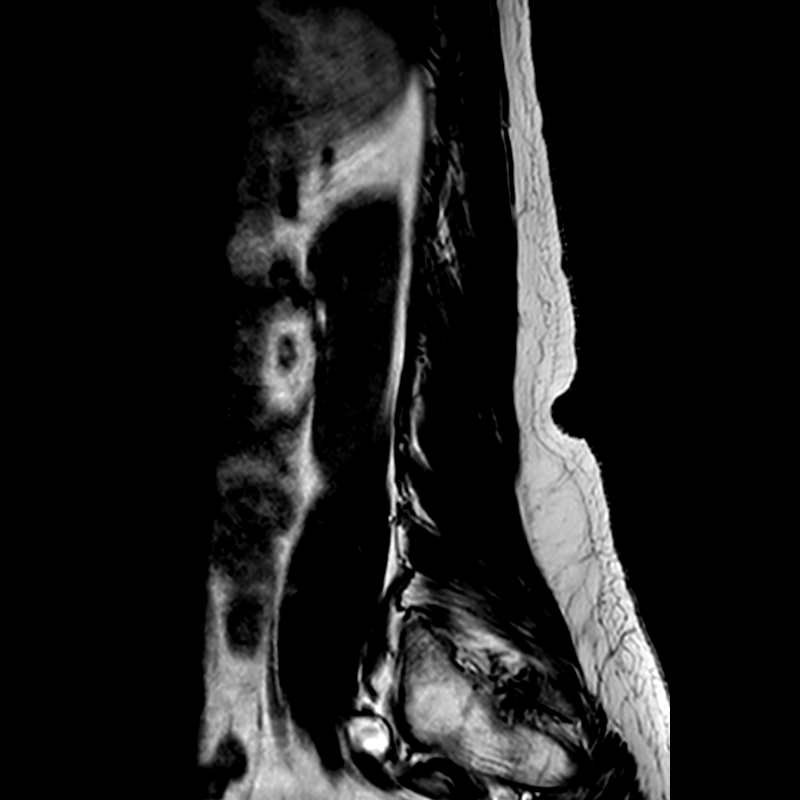
[im 19/19]
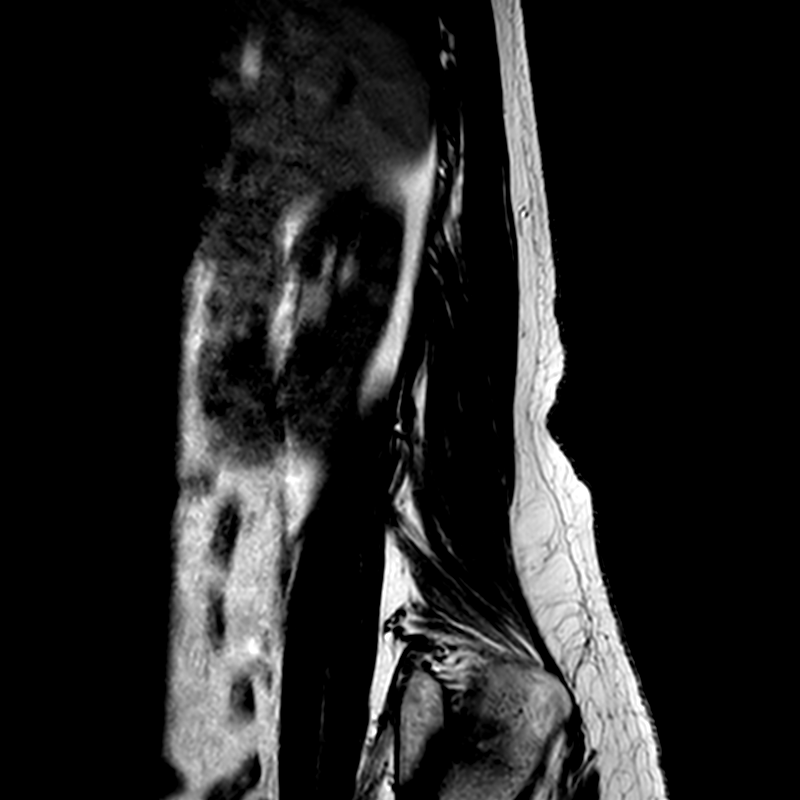

[Series 402: (id)_mdixon_tse · sagittal · 4.0mm · 0.47mm/px · 3 of 19 slices shown]
[im 1/19]
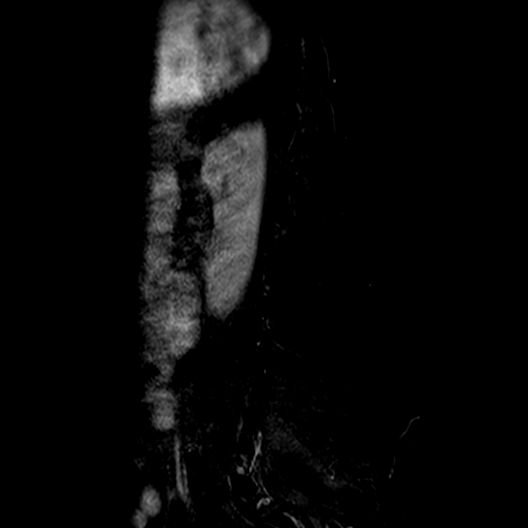
[im 10/19]
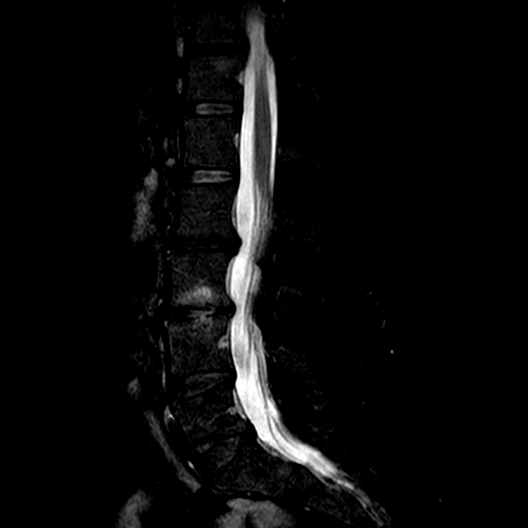
[im 19/19]
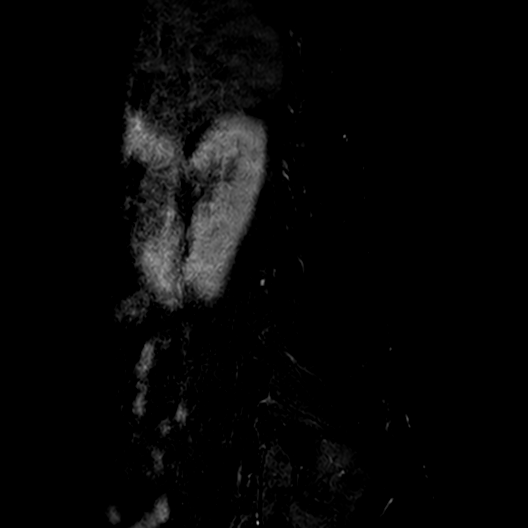

[Series 403: st2w_mdixon_tse · sagittal · 4.0mm · 0.47mm/px · 3 of 19 slices shown]
[im 1/19]
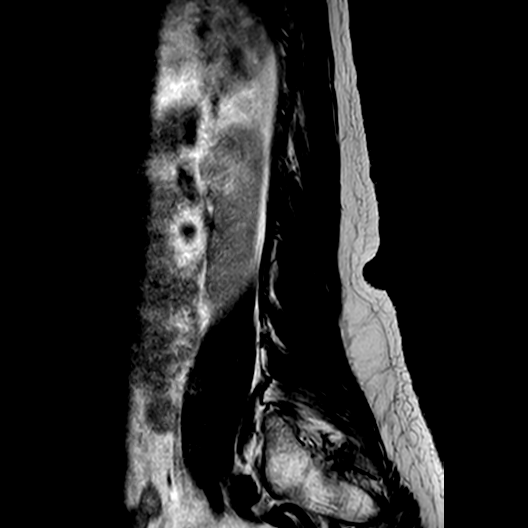
[im 10/19]
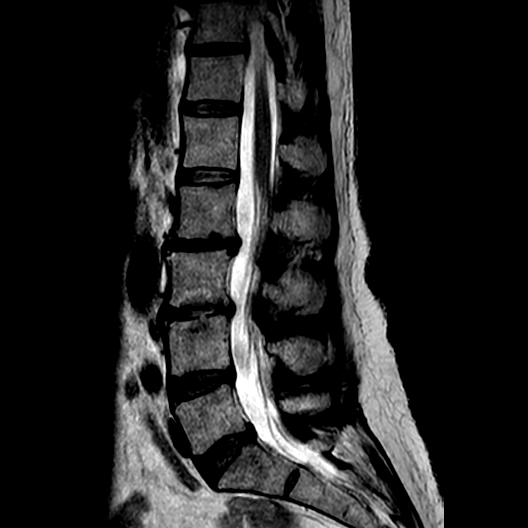
[im 19/19]
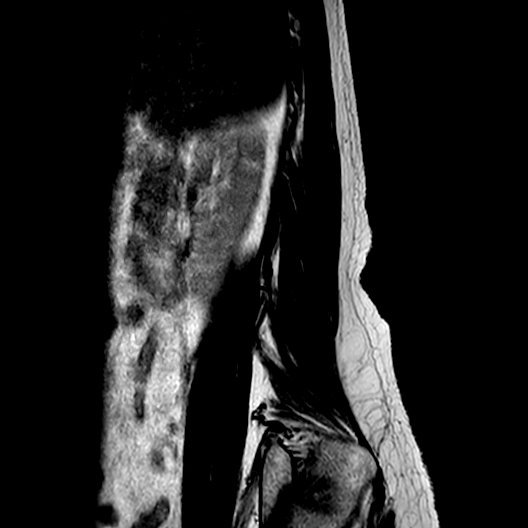

[Series 502: (id) view_ax mpr · axial · 1.0mm · 0.25mm/px · 1 of 139 slices shown]
[im 8/139]
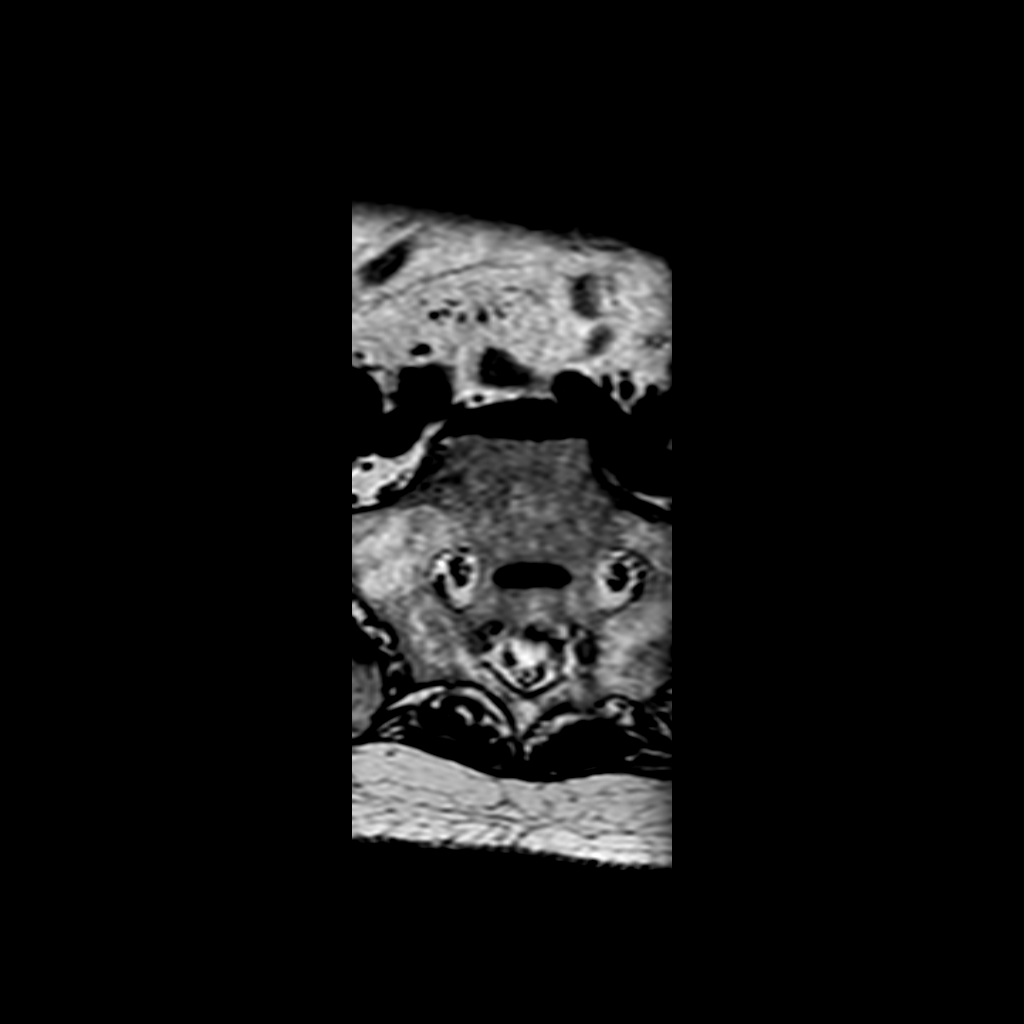

[11 of 48 positions shown; findings below may reference images not displayed]

FINDINGS: -------------------------------------------------------------------------------- 
------ 
GENERAL: 
Nomenclature is based on 5 lumbar type vertebral bodies.     
ALIGNMENT: Rightward curvature of the lumbar spine with mild right lateral 
subluxation of L3 relative to L4. Mild retrolisthesis of L2 on L3, L4 on L5, L5 
on S1. 
VERTEBRAL BODY HEIGHT: Normal.  
MARROW SIGNAL: Scattered Schmorls nodes are present, most predominant at L3-L4. 
CORD SIGNAL: Normal distal spinal cord and cauda equina.  Conus terminates at L2 
inferior endplate level. 
ADDITIONAL FINDINGS: None. 
Modic I-II: Modic change at L2-L3, L3-L4. 
Ligamentum Flavum > 2.5 mm: All levels. 
-------------------------------------------------------------------------------- 
------ 
SEGMENTAL: 
T12-L1: No significant central canal narrowing.  No significant right neural 
foraminal narrowing. No significant left neural foraminal narrowing.  
L1-L2: No significant central canal narrowing.  No significant right neural 
foraminal narrowing. No significant left neural foraminal narrowing.  
L2-L3: Disc bulge. No significant central canal narrowing.  No significant right 
neural foraminal narrowing. No significant left neural foraminal narrowing.  
L3-L4: Disc bulge. Left greater than right facet hypertrophy. No significant 
central canal narrowing. Mild left subarticular recess narrowing.  No 
significant right neural foraminal narrowing. Mild left neural foraminal 
narrowing.  
L4-L5: Disc bulge. Left greater than right facet hypertrophy. No significant 
central canal narrowing.  No significant right neural foraminal narrowing. No 
significant left neural foraminal narrowing.  
L5-S1: Bilateral facet hypertrophy. Trace disc bulge. No significant central 
canal narrowing.  No significant right neural foraminal narrowing. Mild left 
neural foraminal narrowing.  
-------------------------------------------------------------------------------- 
------
IMPRESSION: 1.  Discogenic/degenerative changes as above. 
2.  No severe central canal narrowing.  
3.  No severe neural foraminal narrowing.
